# Patient Record
Sex: Male | Born: 1965 | Race: White | Hispanic: No | Marital: Married | Smoking: Light tobacco smoker
Health system: Southern US, Community
[De-identification: ages and names within clinical notes are randomized; demographics above are authoritative.]

## PROBLEM LIST (undated history)

## (undated) ENCOUNTER — Emergency Department (HOSPITAL_COMMUNITY): Payer: Self-pay | Source: Home / Self Care

## (undated) DIAGNOSIS — C4490 Unspecified malignant neoplasm of skin, unspecified: Secondary | ICD-10-CM

## (undated) DIAGNOSIS — N200 Calculus of kidney: Secondary | ICD-10-CM

## (undated) HISTORY — PX: SKIN CANCER EXCISION: SHX779

## (undated) HISTORY — DX: Unspecified malignant neoplasm of skin, unspecified: C44.90

## (undated) HISTORY — DX: Calculus of kidney: N20.0

## (undated) HISTORY — PX: KIDNEY STONE SURGERY: SHX686

## (undated) HISTORY — PX: FRACTURE SURGERY: SHX138

---

## 2005-02-18 ENCOUNTER — Ambulatory Visit: Payer: Self-pay | Admitting: Internal Medicine

## 2005-02-19 ENCOUNTER — Encounter: Admission: RE | Admit: 2005-02-19 | Discharge: 2005-02-19 | Payer: Self-pay | Admitting: Internal Medicine

## 2006-11-20 IMAGING — CR DG NECK SOFT TISSUE
1 series · 1 of 1 positions shown · non-contrast
Comparison: none

CLINICAL DATA: Pharyngitis.  Right sided neck pain.  Evaluate for retropharyngeal abscess.
 SOFT TISSUE NECK, ONE VIEW:

[view not recorded]
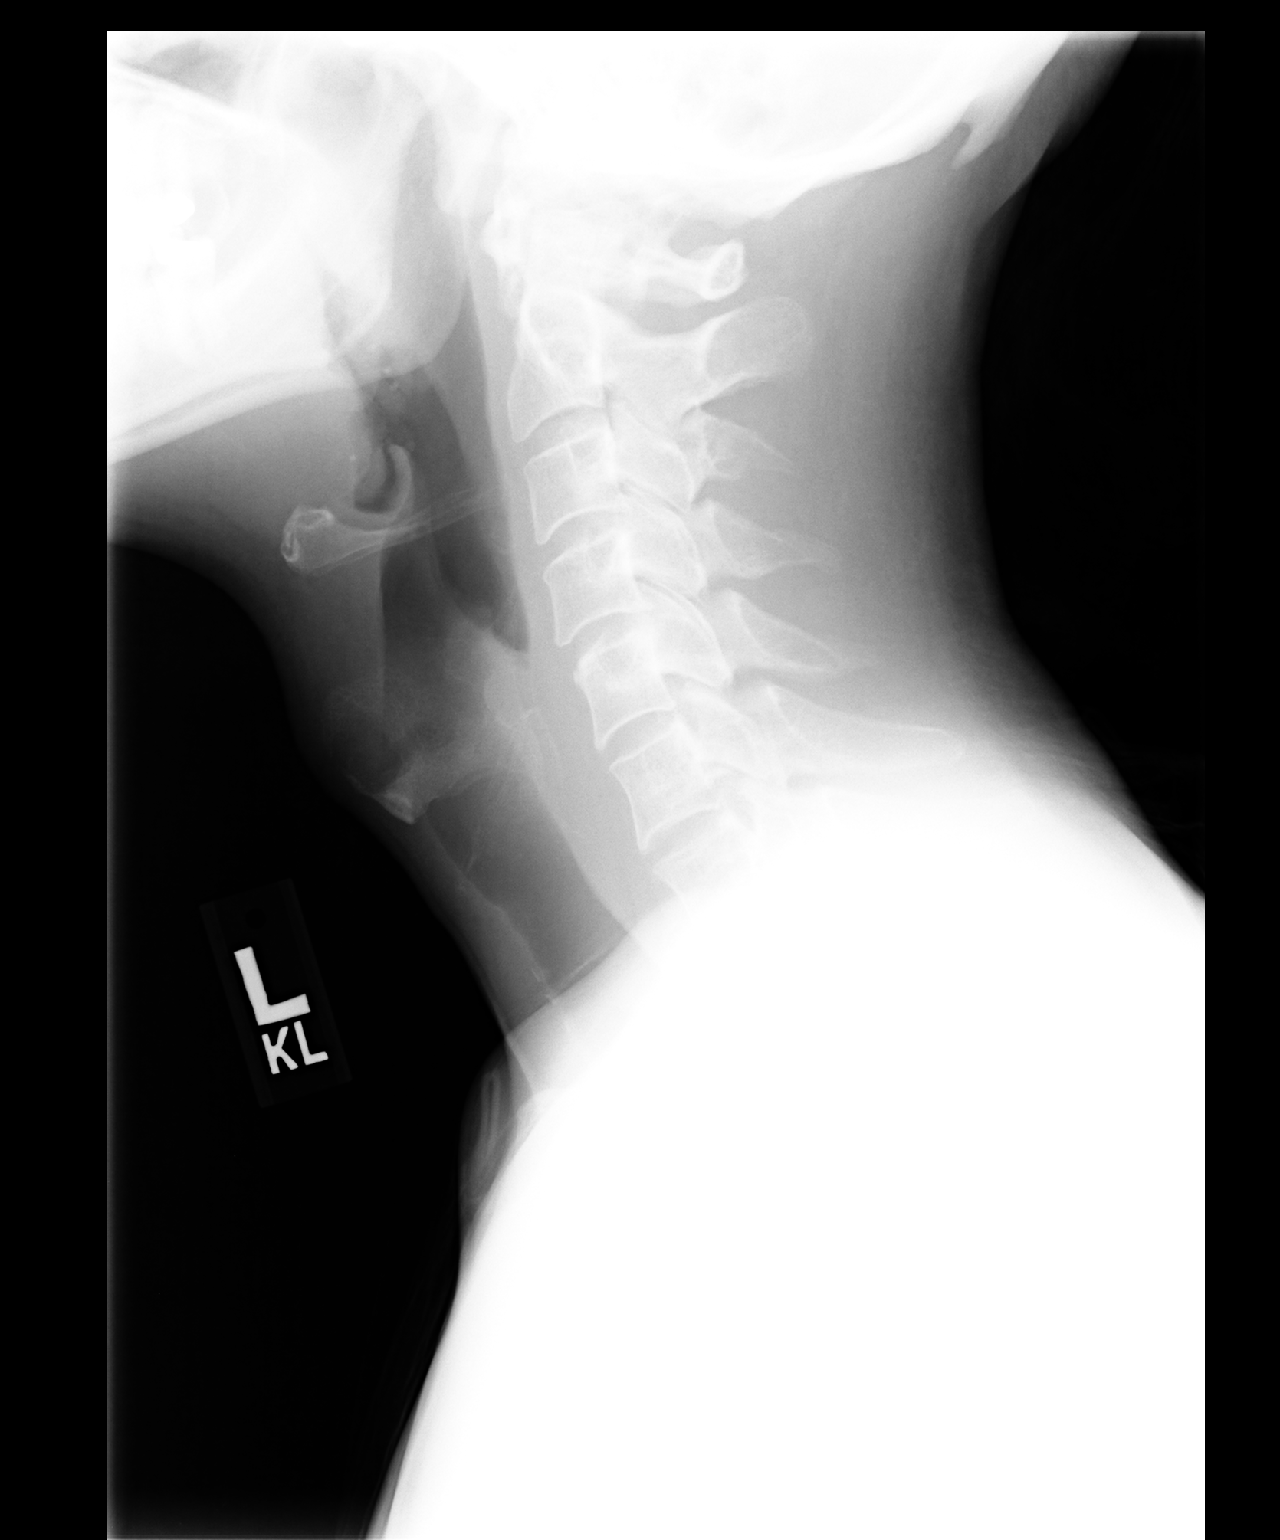

[1 of 1 positions shown; findings below may reference images not displayed]

FINDINGS: Prevertebral soft tissues are within normal limits without gas or focal swelling.  Epiglottis and aryepiglottic folds are sharp.
IMPRESSION: No evidence of retropharyngeal abscess.

## 2016-05-12 ENCOUNTER — Ambulatory Visit (INDEPENDENT_AMBULATORY_CARE_PROVIDER_SITE_OTHER): Payer: Federal, State, Local not specified - PPO | Admitting: Psychology

## 2016-05-12 DIAGNOSIS — F341 Dysthymic disorder: Secondary | ICD-10-CM

## 2016-06-02 ENCOUNTER — Ambulatory Visit: Payer: Self-pay | Admitting: Psychology

## 2016-06-20 ENCOUNTER — Ambulatory Visit (INDEPENDENT_AMBULATORY_CARE_PROVIDER_SITE_OTHER): Payer: Federal, State, Local not specified - PPO | Admitting: Psychology

## 2016-06-20 DIAGNOSIS — F341 Dysthymic disorder: Secondary | ICD-10-CM | POA: Diagnosis not present

## 2016-09-26 ENCOUNTER — Ambulatory Visit (INDEPENDENT_AMBULATORY_CARE_PROVIDER_SITE_OTHER): Payer: Federal, State, Local not specified - PPO | Admitting: Psychology

## 2016-09-26 DIAGNOSIS — F341 Dysthymic disorder: Secondary | ICD-10-CM

## 2019-06-06 DIAGNOSIS — L578 Other skin changes due to chronic exposure to nonionizing radiation: Secondary | ICD-10-CM | POA: Diagnosis not present

## 2019-06-06 DIAGNOSIS — Z85828 Personal history of other malignant neoplasm of skin: Secondary | ICD-10-CM | POA: Diagnosis not present

## 2019-06-06 DIAGNOSIS — L570 Actinic keratosis: Secondary | ICD-10-CM | POA: Diagnosis not present

## 2019-06-06 DIAGNOSIS — Z08 Encounter for follow-up examination after completed treatment for malignant neoplasm: Secondary | ICD-10-CM | POA: Diagnosis not present

## 2019-06-06 DIAGNOSIS — D045 Carcinoma in situ of skin of trunk: Secondary | ICD-10-CM | POA: Diagnosis not present

## 2019-06-06 DIAGNOSIS — C44612 Basal cell carcinoma of skin of right upper limb, including shoulder: Secondary | ICD-10-CM | POA: Diagnosis not present

## 2019-06-06 DIAGNOSIS — D485 Neoplasm of uncertain behavior of skin: Secondary | ICD-10-CM | POA: Diagnosis not present

## 2019-06-13 DIAGNOSIS — C44612 Basal cell carcinoma of skin of right upper limb, including shoulder: Secondary | ICD-10-CM | POA: Diagnosis not present

## 2019-06-13 DIAGNOSIS — D045 Carcinoma in situ of skin of trunk: Secondary | ICD-10-CM | POA: Diagnosis not present

## 2019-06-13 DIAGNOSIS — L905 Scar conditions and fibrosis of skin: Secondary | ICD-10-CM | POA: Diagnosis not present

## 2019-12-05 DIAGNOSIS — D485 Neoplasm of uncertain behavior of skin: Secondary | ICD-10-CM | POA: Diagnosis not present

## 2019-12-05 DIAGNOSIS — Z85828 Personal history of other malignant neoplasm of skin: Secondary | ICD-10-CM | POA: Diagnosis not present

## 2019-12-05 DIAGNOSIS — Z08 Encounter for follow-up examination after completed treatment for malignant neoplasm: Secondary | ICD-10-CM | POA: Diagnosis not present

## 2019-12-05 DIAGNOSIS — L578 Other skin changes due to chronic exposure to nonionizing radiation: Secondary | ICD-10-CM | POA: Diagnosis not present

## 2019-12-05 DIAGNOSIS — L720 Epidermal cyst: Secondary | ICD-10-CM | POA: Diagnosis not present

## 2019-12-05 DIAGNOSIS — D0462 Carcinoma in situ of skin of left upper limb, including shoulder: Secondary | ICD-10-CM | POA: Diagnosis not present

## 2020-01-16 DIAGNOSIS — D0462 Carcinoma in situ of skin of left upper limb, including shoulder: Secondary | ICD-10-CM | POA: Diagnosis not present

## 2020-01-16 DIAGNOSIS — L905 Scar conditions and fibrosis of skin: Secondary | ICD-10-CM | POA: Diagnosis not present

## 2020-01-16 DIAGNOSIS — L570 Actinic keratosis: Secondary | ICD-10-CM | POA: Diagnosis not present

## 2020-01-16 DIAGNOSIS — L720 Epidermal cyst: Secondary | ICD-10-CM | POA: Diagnosis not present

## 2020-03-19 ENCOUNTER — Encounter: Payer: Self-pay | Admitting: Family Medicine

## 2020-03-19 ENCOUNTER — Ambulatory Visit (INDEPENDENT_AMBULATORY_CARE_PROVIDER_SITE_OTHER): Payer: Federal, State, Local not specified - PPO | Admitting: Family Medicine

## 2020-03-19 ENCOUNTER — Ambulatory Visit (INDEPENDENT_AMBULATORY_CARE_PROVIDER_SITE_OTHER): Payer: Federal, State, Local not specified - PPO

## 2020-03-19 ENCOUNTER — Other Ambulatory Visit: Payer: Self-pay

## 2020-03-19 VITALS — BP 132/80 | HR 49 | Ht 70.67 in | Wt 142.9 lb

## 2020-03-19 DIAGNOSIS — M25512 Pain in left shoulder: Secondary | ICD-10-CM

## 2020-03-19 DIAGNOSIS — G8929 Other chronic pain: Secondary | ICD-10-CM

## 2020-03-19 NOTE — Assessment & Plan Note (Signed)
Possible labral etiology.  Xrays of L shoulder ordered Discussed scheduling with Dr. Dianah Field as well for evaluation of shoulder.

## 2020-03-19 NOTE — Patient Instructions (Signed)
Great to meet you today! Have xray completed downstairs.  Follow up with me at your convenience for annual exam. Come fasting to this appt.

## 2020-03-19 NOTE — Progress Notes (Signed)
Errol Ala - 54 y.o. male MRN [id number]  Date of birth: 1965-08-19  Subjective Chief Complaint  Patient presents with  . Establish Care    HPI Marckus Hanover is a 54 y.o. male here today for initial visit.  He has been in pretty good health.  He plans on scheduling an annual exam soon.  He has had some L shoulder pain for several months. Does not recall any specific injury.  Certain positional movements make this worse but position that causes pain varies.  He denies neck pain, numbness or tingling.    ROS:  A comprehensive ROS was completed and negative except as noted per HPI  Allergies  Allergen Reactions  . Ampicillin     Infant reaction    Past Medical History:  Diagnosis Date  . Kidney stones   . Skin cancer     Past Surgical History:  Procedure Laterality Date  . FRACTURE SURGERY    . KIDNEY STONE SURGERY    . SKIN CANCER EXCISION      Social History   Socioeconomic History  . Marital status: Married    Spouse name: Not on file  . Number of children: Not on file  . Years of education: Not on file  . Highest education level: Not on file  Occupational History  . Not on file  Tobacco Use  . Smoking status: Light Tobacco Smoker    Types: Cigars  . Smokeless tobacco: Never Used  Vaping Use  . Vaping Use: Never used  Substance and Sexual Activity  . Alcohol use: Yes    Alcohol/week: 2.0 - 3.0 standard drinks    Types: 2 - 3 Cans of beer per week  . Drug use: Yes    Types: Marijuana    Comment: Occasionally  . Sexual activity: Yes    Partners: Female  Other Topics Concern  . Not on file  Social History Narrative  . Not on file   Social Determinants of Health   Financial Resource Strain:   . Difficulty of Paying Living Expenses: Not on file  Food Insecurity:   . Worried About Charity fundraiser in the Last Year: Not on file  . Ran Out of Food in the Last Year: Not on file  Transportation Needs:   . Lack of Transportation (Medical): Not on file  .  Lack of Transportation (Non-Medical): Not on file  Physical Activity:   . Days of Exercise per Week: Not on file  . Minutes of Exercise per Session: Not on file  Stress:   . Feeling of Stress : Not on file  Social Connections:   . Frequency of Communication with Friends and Family: Not on file  . Frequency of Social Gatherings with Friends and Family: Not on file  . Attends Religious Services: Not on file  . Active Member of Clubs or Organizations: Not on file  . Attends Archivist Meetings: Not on file  . Marital Status: Not on file    Family History  Problem Relation Age of Onset  . Breast cancer Mother   . Atrial fibrillation Mother   . Brain cancer Father   . Throat cancer Father   . Skin cancer Father   . Congestive Heart Failure Maternal Grandmother     Health Maintenance  Topic Date Due  . Hepatitis C Screening  Never done  . HIV Screening  Never done  . TETANUS/TDAP  Never done  . COLONOSCOPY  Never done  . INFLUENZA  VACCINE  Never done  . COVID-19 Vaccine  Completed     ----------------------------------------------------------------------------------------------------------------------------------------------------------------------------------------------------------------- Physical Exam BP 132/80 (BP Location: Left Arm, Patient Position: Sitting, Cuff Size: Normal)   Pulse (!) 49   Ht 5' 10.67" (1.795 m)   Wt 142 lb 14.4 oz (64.8 kg)   SpO2 100%   BMI 20.12 kg/m   Physical Exam Constitutional:      Appearance: Normal appearance.  HENT:     Head: Normocephalic and atraumatic.  Cardiovascular:     Rate and Rhythm: Normal rate and regular rhythm.  Musculoskeletal:     Cervical back: Neck supple.     Comments: ROM of shoulder is fairly normal.  TTP along bicipital groove and anterior shoulder.   No significant pain with biceps testing.  Rotator cuff strength 5/5.  No impingement symptoms.    Neurological:     General: No focal deficit  present.     Mental Status: He is alert.  Psychiatric:        Mood and Affect: Mood normal.        Behavior: Behavior normal.     ------------------------------------------------------------------------------------------------------------------------------------------------------------------------------------------------------------------- Assessment and Plan  Left shoulder pain Possible labral etiology.  Xrays of L shoulder ordered Discussed scheduling with Dr. Dianah Field as well for evaluation of shoulder.    No orders of the defined types were placed in this encounter.   Return for annual exam at his convenience.  .    This visit occurred during the SARS-CoV-2 public health emergency.  Safety protocols were in place, including screening questions prior to the visit, additional usage of staff PPE, and extensive cleaning of exam room while observing appropriate contact time as indicated for disinfecting solutions.

## 2020-03-23 ENCOUNTER — Ambulatory Visit: Payer: Federal, State, Local not specified - PPO | Admitting: Sports Medicine

## 2021-10-17 DIAGNOSIS — Z85828 Personal history of other malignant neoplasm of skin: Secondary | ICD-10-CM | POA: Diagnosis not present

## 2021-10-17 DIAGNOSIS — D485 Neoplasm of uncertain behavior of skin: Secondary | ICD-10-CM | POA: Diagnosis not present

## 2021-10-17 DIAGNOSIS — D224 Melanocytic nevi of scalp and neck: Secondary | ICD-10-CM | POA: Diagnosis not present

## 2021-10-17 DIAGNOSIS — L578 Other skin changes due to chronic exposure to nonionizing radiation: Secondary | ICD-10-CM | POA: Diagnosis not present

## 2021-10-17 DIAGNOSIS — L570 Actinic keratosis: Secondary | ICD-10-CM | POA: Diagnosis not present

## 2021-10-17 DIAGNOSIS — C4441 Basal cell carcinoma of skin of scalp and neck: Secondary | ICD-10-CM | POA: Diagnosis not present

## 2021-10-17 DIAGNOSIS — Z08 Encounter for follow-up examination after completed treatment for malignant neoplasm: Secondary | ICD-10-CM | POA: Diagnosis not present

## 2021-10-17 DIAGNOSIS — C44319 Basal cell carcinoma of skin of other parts of face: Secondary | ICD-10-CM | POA: Diagnosis not present

## 2021-12-09 DIAGNOSIS — S60453A Superficial foreign body of left middle finger, initial encounter: Secondary | ICD-10-CM | POA: Diagnosis not present

## 2021-12-09 DIAGNOSIS — Z23 Encounter for immunization: Secondary | ICD-10-CM | POA: Diagnosis not present

## 2021-12-18 IMAGING — DX DG SHOULDER 2+V*L*
3 series · 3 of 3 positions shown · non-contrast
Comparison: None.

CLINICAL DATA: Left shoulder pain.

EXAM:
LEFT SHOULDER - 2+ VIEW

[shoulder grashey]
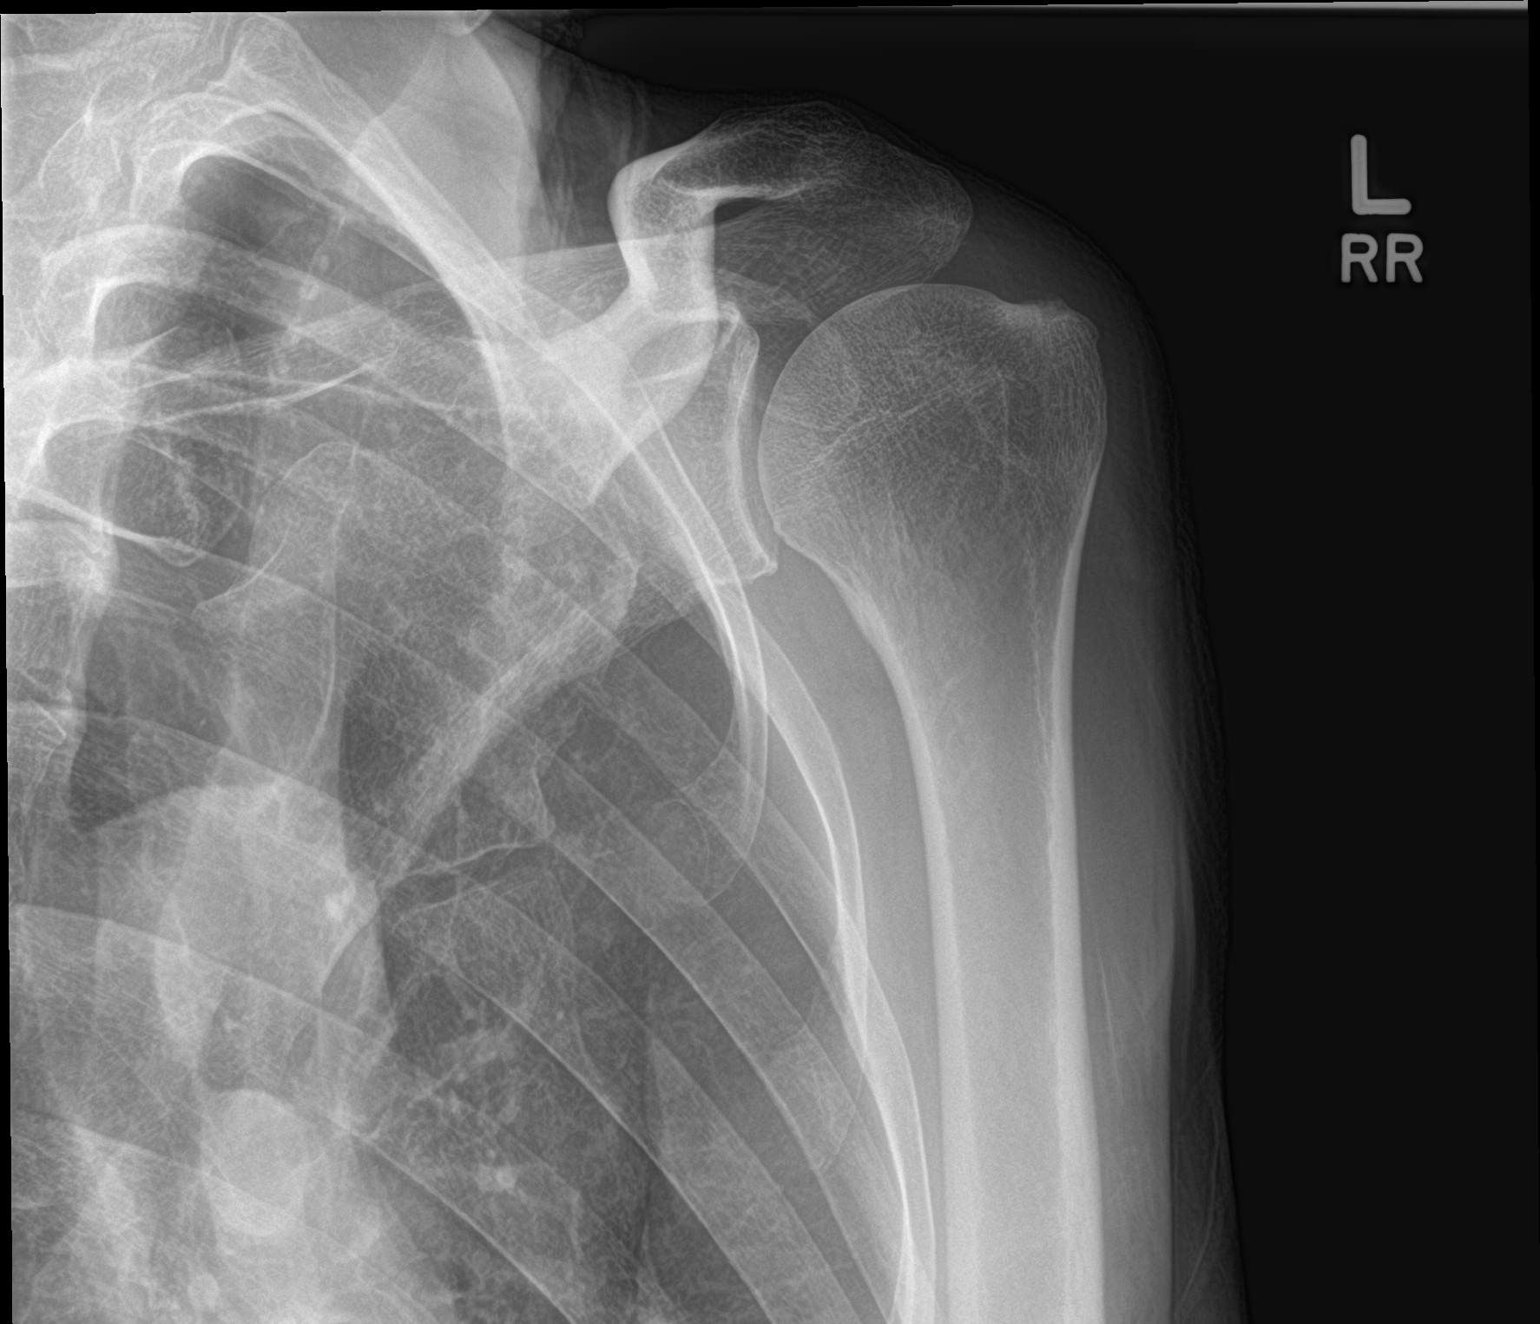

[shoulder y view]
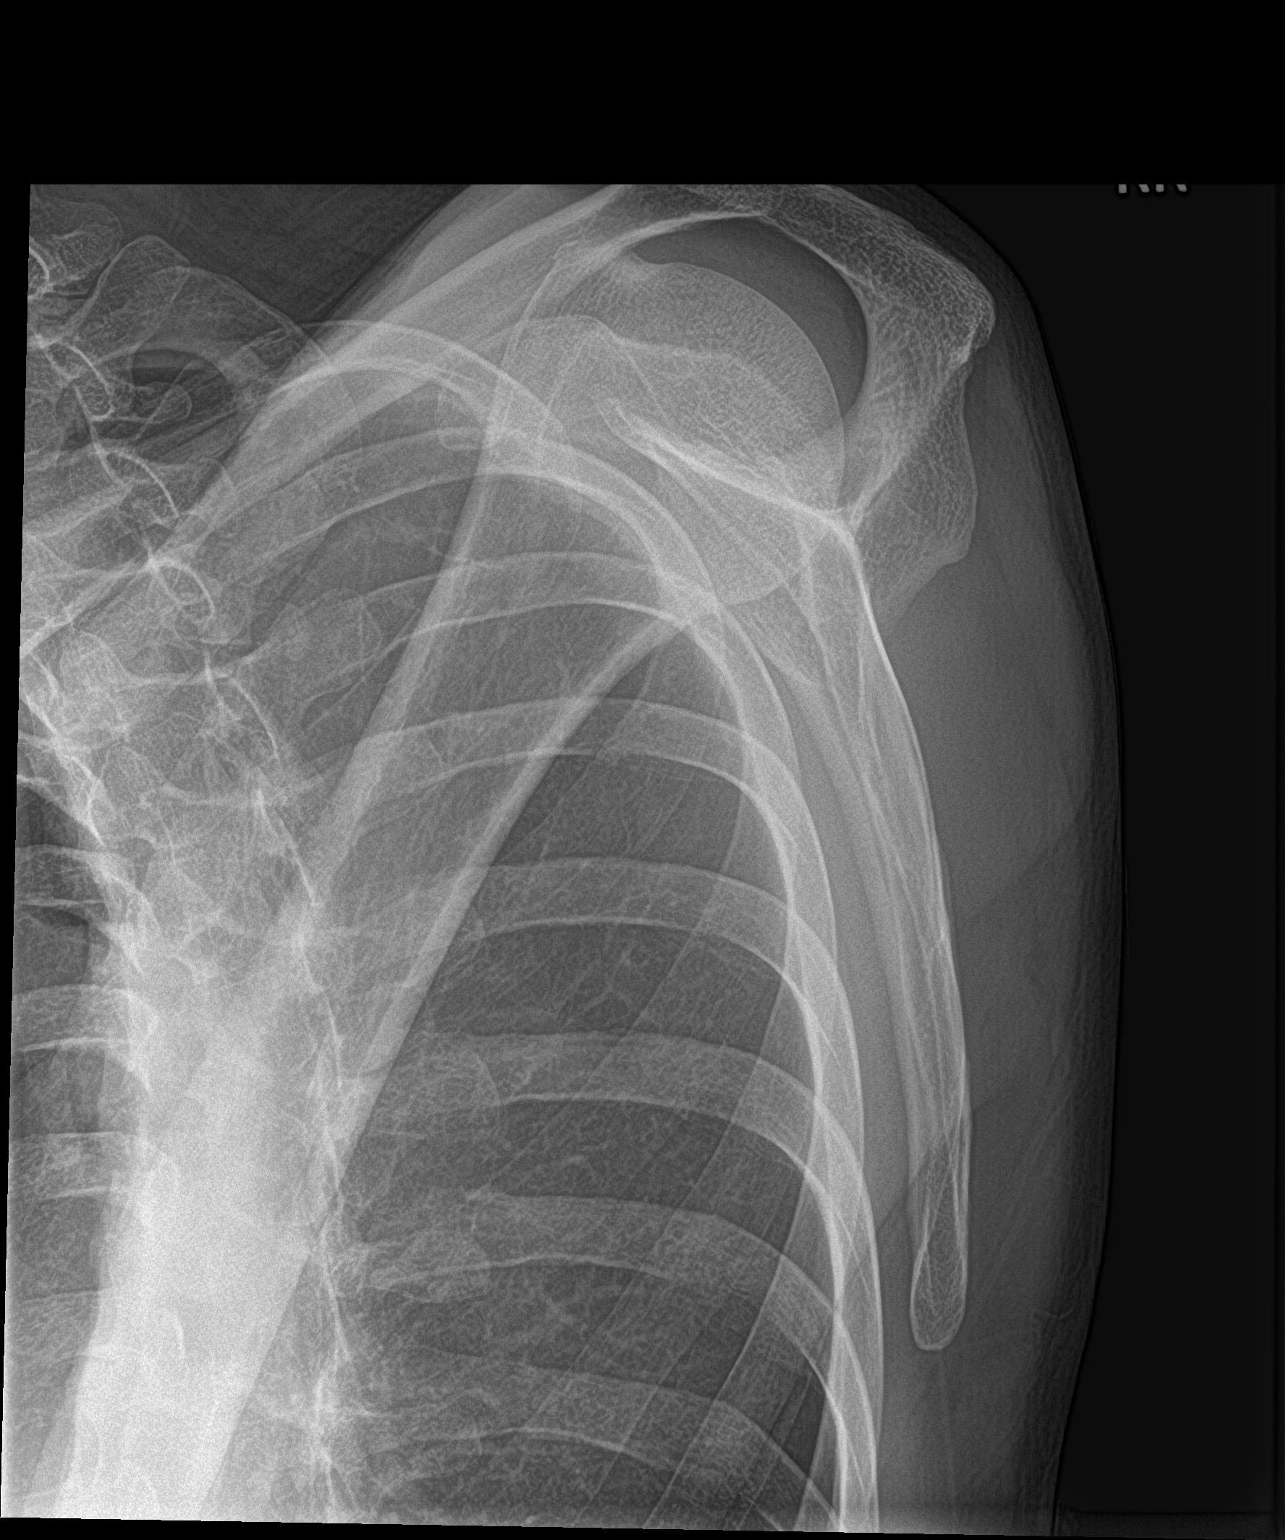

[shoulder axillary]
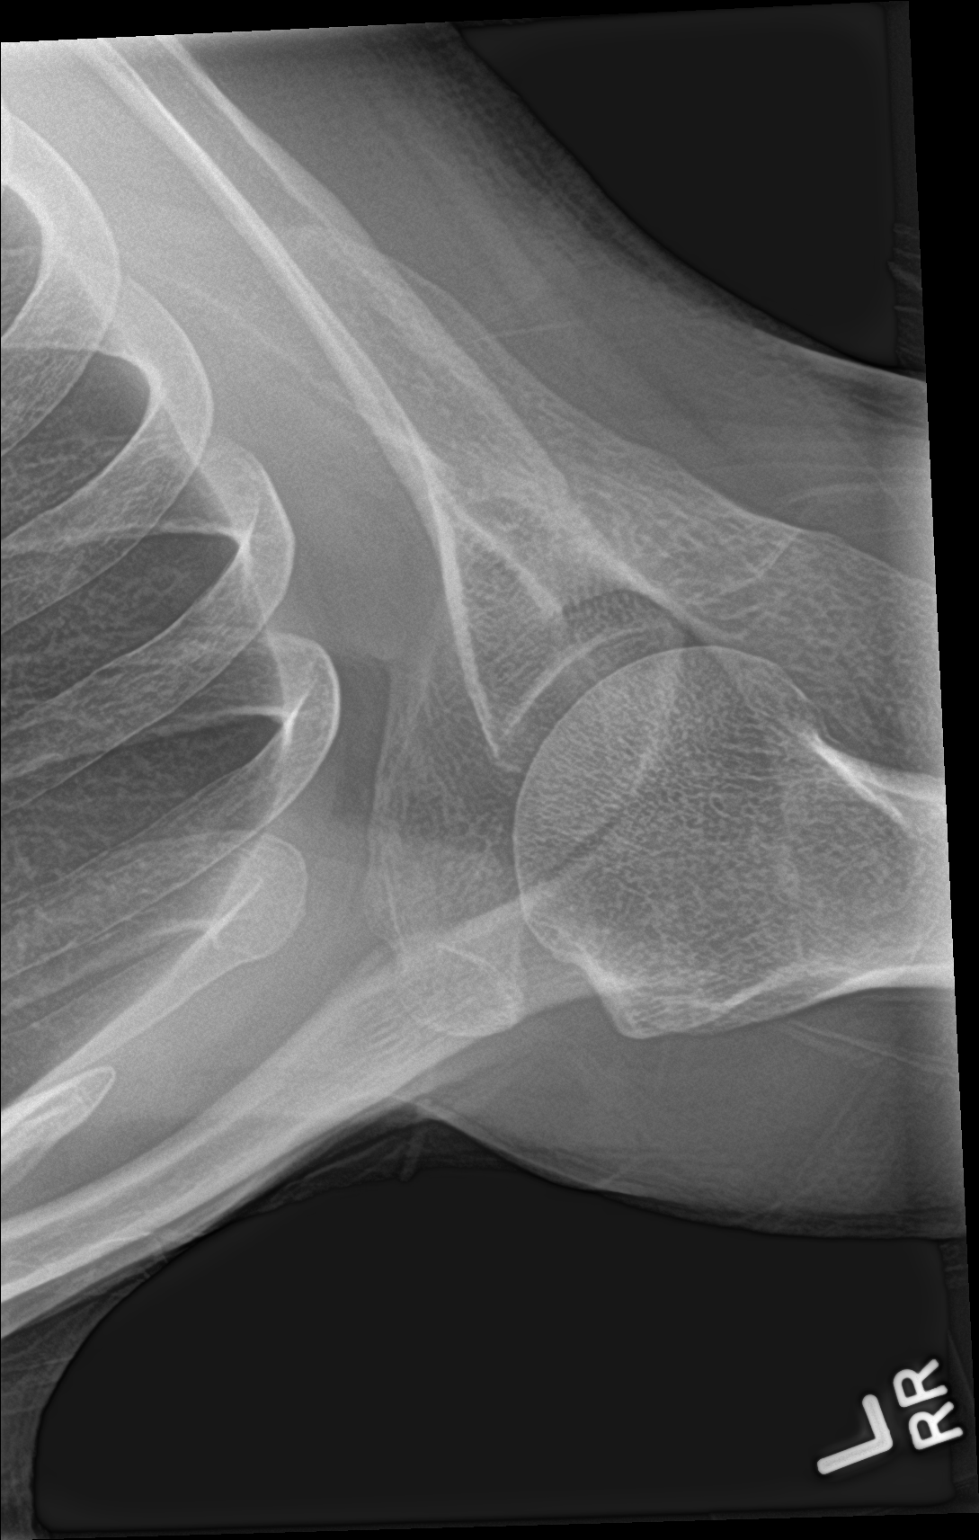

[3 of 3 positions shown; findings below may reference images not displayed]

FINDINGS: There is no evidence of fracture or dislocation. There is no
evidence of arthropathy or other focal bone abnormality. Soft
tissues are unremarkable.
IMPRESSION: Negative left shoulder radiographs.

## 2022-01-21 DIAGNOSIS — L905 Scar conditions and fibrosis of skin: Secondary | ICD-10-CM | POA: Diagnosis not present

## 2022-01-21 DIAGNOSIS — D485 Neoplasm of uncertain behavior of skin: Secondary | ICD-10-CM | POA: Diagnosis not present

## 2022-01-21 DIAGNOSIS — C44529 Squamous cell carcinoma of skin of other part of trunk: Secondary | ICD-10-CM | POA: Diagnosis not present

## 2022-01-21 DIAGNOSIS — C4441 Basal cell carcinoma of skin of scalp and neck: Secondary | ICD-10-CM | POA: Diagnosis not present

## 2022-02-21 DIAGNOSIS — C44529 Squamous cell carcinoma of skin of other part of trunk: Secondary | ICD-10-CM | POA: Diagnosis not present

## 2022-02-21 DIAGNOSIS — L905 Scar conditions and fibrosis of skin: Secondary | ICD-10-CM | POA: Diagnosis not present

## 2022-04-30 DIAGNOSIS — Z85828 Personal history of other malignant neoplasm of skin: Secondary | ICD-10-CM | POA: Diagnosis not present

## 2022-04-30 DIAGNOSIS — L570 Actinic keratosis: Secondary | ICD-10-CM | POA: Diagnosis not present

## 2022-04-30 DIAGNOSIS — Z08 Encounter for follow-up examination after completed treatment for malignant neoplasm: Secondary | ICD-10-CM | POA: Diagnosis not present

## 2022-04-30 DIAGNOSIS — L578 Other skin changes due to chronic exposure to nonionizing radiation: Secondary | ICD-10-CM | POA: Diagnosis not present

## 2022-04-30 DIAGNOSIS — D485 Neoplasm of uncertain behavior of skin: Secondary | ICD-10-CM | POA: Diagnosis not present

## 2022-04-30 DIAGNOSIS — L309 Dermatitis, unspecified: Secondary | ICD-10-CM | POA: Diagnosis not present

## 2023-09-09 ENCOUNTER — Ambulatory Visit: Admitting: Urgent Care

## 2024-04-12 ENCOUNTER — Ambulatory Visit: Payer: Self-pay | Admitting: Student in an Organized Health Care Education/Training Program

## 2024-05-04 ENCOUNTER — Encounter: Payer: Self-pay | Admitting: Student in an Organized Health Care Education/Training Program

## 2024-05-04 ENCOUNTER — Ambulatory Visit: Payer: Self-pay | Admitting: Student in an Organized Health Care Education/Training Program

## 2024-05-04 VITALS — BP 136/81 | HR 44 | Ht 71.5 in | Wt 149.0 lb

## 2024-05-04 DIAGNOSIS — Z114 Encounter for screening for human immunodeficiency virus [HIV]: Secondary | ICD-10-CM | POA: Diagnosis not present

## 2024-05-04 DIAGNOSIS — R5383 Other fatigue: Secondary | ICD-10-CM | POA: Diagnosis not present

## 2024-05-04 DIAGNOSIS — M7701 Medial epicondylitis, right elbow: Secondary | ICD-10-CM | POA: Diagnosis not present

## 2024-05-04 DIAGNOSIS — Z125 Encounter for screening for malignant neoplasm of prostate: Secondary | ICD-10-CM

## 2024-05-04 DIAGNOSIS — Z1159 Encounter for screening for other viral diseases: Secondary | ICD-10-CM

## 2024-05-04 DIAGNOSIS — Z131 Encounter for screening for diabetes mellitus: Secondary | ICD-10-CM

## 2024-05-04 DIAGNOSIS — Z13220 Encounter for screening for lipoid disorders: Secondary | ICD-10-CM | POA: Diagnosis not present

## 2024-05-04 DIAGNOSIS — Z0000 Encounter for general adult medical examination without abnormal findings: Secondary | ICD-10-CM | POA: Insufficient documentation

## 2024-05-04 DIAGNOSIS — Z1211 Encounter for screening for malignant neoplasm of colon: Secondary | ICD-10-CM | POA: Diagnosis not present

## 2024-05-04 DIAGNOSIS — F17200 Nicotine dependence, unspecified, uncomplicated: Secondary | ICD-10-CM | POA: Insufficient documentation

## 2024-05-04 DIAGNOSIS — M1611 Unilateral primary osteoarthritis, right hip: Secondary | ICD-10-CM | POA: Insufficient documentation

## 2024-05-04 LAB — COMPREHENSIVE METABOLIC PANEL WITH GFR
ALT: 17 U/L (ref 3–53)
AST: 15 U/L (ref 5–37)
Albumin: 4 g/dL (ref 3.5–5.2)
Alkaline Phosphatase: 66 U/L (ref 39–117)
BUN: 17 mg/dL (ref 6–23)
CO2: 32 meq/L (ref 19–32)
Calcium: 9 mg/dL (ref 8.4–10.5)
Chloride: 105 meq/L (ref 96–112)
Creatinine, Ser: 0.89 mg/dL (ref 0.40–1.50)
GFR: 95.94 mL/min
Glucose, Bld: 86 mg/dL (ref 70–99)
Potassium: 5 meq/L (ref 3.5–5.1)
Sodium: 141 meq/L (ref 135–145)
Total Bilirubin: 0.3 mg/dL (ref 0.2–1.2)
Total Protein: 6 g/dL (ref 6.0–8.3)

## 2024-05-04 LAB — CBC WITH DIFFERENTIAL/PLATELET
Basophils Absolute: 0.1 K/uL (ref 0.0–0.1)
Basophils Relative: 1.2 % (ref 0.0–3.0)
Eosinophils Absolute: 0.2 K/uL (ref 0.0–0.7)
Eosinophils Relative: 3.1 % (ref 0.0–5.0)
HCT: 48.6 % (ref 39.0–52.0)
Hemoglobin: 16.3 g/dL (ref 13.0–17.0)
Lymphocytes Relative: 28.6 % (ref 12.0–46.0)
Lymphs Abs: 2 K/uL (ref 0.7–4.0)
MCHC: 33.6 g/dL (ref 30.0–36.0)
MCV: 96.7 fl (ref 78.0–100.0)
Monocytes Absolute: 0.9 K/uL (ref 0.1–1.0)
Monocytes Relative: 12.4 % — ABNORMAL HIGH (ref 3.0–12.0)
Neutro Abs: 3.8 K/uL (ref 1.4–7.7)
Neutrophils Relative %: 54.7 % (ref 43.0–77.0)
Platelets: 235 K/uL (ref 150.0–400.0)
RBC: 5.02 Mil/uL (ref 4.22–5.81)
RDW: 13.1 % (ref 11.5–15.5)
WBC: 7 K/uL (ref 4.0–10.5)

## 2024-05-04 LAB — IBC + FERRITIN
Ferritin: 97.4 ng/mL (ref 22.0–322.0)
Iron: 65 ug/dL (ref 42–165)
Saturation Ratios: 16.9 % — ABNORMAL LOW (ref 20.0–50.0)
TIBC: 385 ug/dL (ref 250.0–450.0)
Transferrin: 275 mg/dL (ref 212.0–360.0)

## 2024-05-04 LAB — LIPID PANEL
Cholesterol: 201 mg/dL — ABNORMAL HIGH (ref 28–200)
HDL: 62.4 mg/dL
LDL Cholesterol: 123 mg/dL — ABNORMAL HIGH (ref 10–99)
NonHDL: 138.89
Total CHOL/HDL Ratio: 3
Triglycerides: 77 mg/dL (ref 10.0–149.0)
VLDL: 15.4 mg/dL (ref 0.0–40.0)

## 2024-05-04 LAB — HEMOGLOBIN A1C: Hgb A1c MFr Bld: 5.5 % (ref 4.6–6.5)

## 2024-05-04 LAB — MAGNESIUM: Magnesium: 2.3 mg/dL (ref 1.5–2.5)

## 2024-05-04 LAB — VITAMIN B12: Vitamin B-12: 119 pg/mL — ABNORMAL LOW (ref 211–911)

## 2024-05-04 LAB — PSA: PSA: 0.51 ng/mL (ref 0.10–4.00)

## 2024-05-04 LAB — TSH: TSH: 3.95 u[IU]/mL (ref 0.35–5.50)

## 2024-05-04 MED ORDER — VARENICLINE TARTRATE 1 MG PO TABS: ORAL_TABLET | ORAL | 0 refills | Status: AC

## 2024-05-04 NOTE — Patient Instructions (Signed)
" °  VISIT SUMMARY: Today, you came in because you have been feeling very tired and wanted to catch up on your health. We discussed your history of skin cancer, smoking, and various pains you have been experiencing, including in your right leg, hip, and elbow. We also talked about your disrupted sleep and desire to quit smoking.  YOUR PLAN: -NICOTINE DEPENDENCE: Nicotine dependence means you are addicted to nicotine, a substance found in tobacco. To help you quit smoking, I have prescribed Chantix (varenicline), which can increase your chances of quitting successfully. Start taking it a couple of weeks before you plan to quit smoking.  -RIGHT HIP OSTEOARTHRITIS WITH NEUROPATHIC SYMPTOMS: Osteoarthritis is a condition where the cartilage in your joints breaks down, causing pain and stiffness. Your right hip pain is likely due to this and past injuries. I recommend physical therapy to strengthen your hip and improve its function. If your symptoms get worse, we may need to do an x-ray and consider other treatments, including possibly a hip replacement in the future.  -RIGHT MEDIAL EPICONDYLITIS: Medial epicondylitis, also known as golfer's elbow, is a condition that causes pain and swelling on the inside of your elbow. To manage this, I recommend using a forearm brace for support and applying Voltaren gel to reduce inflammation. You can also take ibuprofen or Aleve on days when the pain is worse.  -GENERAL HEALTH MAINTENANCE: We discussed the importance of regular cancer screenings due to your family history and lifestyle. You have options for colon cancer screening, including a home-based stool test or a colonoscopy. Given your smoking history, lung cancer screening is also available, and we will discuss this further with your wife. Prostate cancer screening is optional. I have ordered blood tests to check for anemia, vitamin levels, thyroid function, kidney function, cholesterol levels, and  PSA.  INSTRUCTIONS: Please start taking Chantix as prescribed a couple of weeks before you plan to quit smoking. Schedule an appointment for physical therapy for your right hip. Use a forearm brace and Voltaren gel for your elbow pain, and take ibuprofen or Aleve as needed. Follow up on the blood tests and discuss lung cancer screening with your wife. Consider scheduling colon cancer screening and discuss prostate cancer screening options.   "

## 2024-05-04 NOTE — Assessment & Plan Note (Signed)
 He experiences chronic right hip pain with neuropathic symptoms, likely due to osteoarthritis and past trauma. Recommended physical therapy for strengthening and improving function. An x-ray will be considered if symptoms worsen to assess for degree of arthritis. Discussed the potential future need for hip replacement if symptoms progress.

## 2024-05-04 NOTE — Assessment & Plan Note (Signed)
 Discussed the importance of cancer screenings given family history and lifestyle factors. Colon cancer screening options include a home-based stool test or colonoscopy. Lung cancer screening is available due to smoking history, he declines this for now but wants to think about it.  Prostate cancer screening is optional.  Decided to go forward with fecal immunochemical testing for colon cancer screening and PSA for prostate cancer screening.  He declined vaccinations to influenza and pneumococcal.

## 2024-05-04 NOTE — Assessment & Plan Note (Signed)
 Acute issue over the last few months starting to impact his daily work.  Exam is reassuring.  Will check labs today to rule out anemia, thyroid disease, or electrolyte abnormality/vitamin deficiency as the etiology.  No depressed mood to explain it.  Low risk for OSA based on body habitus.

## 2024-05-04 NOTE — Assessment & Plan Note (Signed)
 He has intermittent right elbow pain, likely due to medial epicondylitis, with symptoms including swelling and pain on the medial side of the elbow, exacerbated by certain movements. Recommended use of a forearm brace for support and advised use of Voltaren gel for topical anti-inflammatory effect. Suggested ibuprofen or Aleve for pain management on bad days.

## 2024-05-04 NOTE — Assessment & Plan Note (Signed)
 He has chronic nicotine dependence with a history of smoking for over 40 years and currently smokes small cigars. He expressed a desire to quit smoking. Chantix (varenicline) is recommended to increase the success rate of quitting smoking to over 50%. Prescribed Chantix for smoking cessation and advised to start it a couple of weeks before quitting smoking.
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
## 2024-05-06 ENCOUNTER — Ambulatory Visit: Payer: Self-pay | Admitting: Student in an Organized Health Care Education/Training Program

## 2024-05-06 DIAGNOSIS — E538 Deficiency of other specified B group vitamins: Secondary | ICD-10-CM

## 2024-05-06 LAB — HIV ANTIBODY (ROUTINE TESTING W REFLEX)
HIV 1&2 Ab, 4th Generation: NONREACTIVE
HIV FINAL INTERPRETATION: NEGATIVE

## 2024-05-06 LAB — HEPATITIS C ANTIBODY: Hepatitis C Ab: NONREACTIVE

## 2024-05-06 MED ORDER — VITAMIN B-12 1000 MCG PO TABS: 1000.0000 ug | ORAL_TABLET | Freq: Every day | ORAL | 3 refills | Status: AC

## 2024-05-06 NOTE — Progress Notes (Signed)
 Reach out to pt. Inform him of provider's message. Pt states he would like to discuss with provider about cancer screening and colonoscopy. Inform pt he has upcoming visit in April with the provider and that he can discuss it with him then but if would like sooner, this cma schedule an appointment. Pt verbalized understanding and stated he will call back.

## 2024-08-08 ENCOUNTER — Ambulatory Visit: Admitting: Student in an Organized Health Care Education/Training Program
